# Patient Record
Sex: Male | Born: 1955 | Race: White | Hispanic: Yes | Marital: Single | Smoking: Never smoker
Health system: Southern US, Community
[De-identification: ages and names within clinical notes are randomized; demographics above are authoritative.]

## PROBLEM LIST (undated history)

## (undated) DIAGNOSIS — F329 Major depressive disorder, single episode, unspecified: Secondary | ICD-10-CM

## (undated) DIAGNOSIS — F32A Depression, unspecified: Secondary | ICD-10-CM

## (undated) HISTORY — PX: DENTAL SURGERY: SHX609

---

## 2016-11-26 ENCOUNTER — Ambulatory Visit
Admission: RE | Admit: 2016-11-26 | Discharge: 2016-11-26 | Disposition: A | Discharge status: Home / Self Care | Payer: Worker's Compensation | Source: Ambulatory Visit | Attending: Family | Admitting: Family

## 2016-11-26 ENCOUNTER — Other Ambulatory Visit: Payer: Self-pay | Admitting: Family

## 2016-11-26 DIAGNOSIS — R52 Pain, unspecified: Secondary | ICD-10-CM

## 2016-11-26 DIAGNOSIS — M25511 Pain in right shoulder: Secondary | ICD-10-CM | POA: Insufficient documentation

## 2018-06-30 ENCOUNTER — Emergency Department
Admission: EM | Admit: 2018-06-30 | Discharge: 2018-06-30 | Disposition: A | Discharge status: Home / Self Care | Payer: No Typology Code available for payment source | Attending: Student in an Organized Health Care Education/Training Program | Admitting: Student in an Organized Health Care Education/Training Program

## 2018-06-30 ENCOUNTER — Other Ambulatory Visit: Payer: Self-pay

## 2018-06-30 ENCOUNTER — Emergency Department: Payer: No Typology Code available for payment source

## 2018-06-30 ENCOUNTER — Encounter: Payer: Self-pay | Admitting: Emergency Medicine

## 2018-06-30 DIAGNOSIS — S6991XA Unspecified injury of right wrist, hand and finger(s), initial encounter: Secondary | ICD-10-CM | POA: Diagnosis present

## 2018-06-30 DIAGNOSIS — Y9301 Activity, walking, marching and hiking: Secondary | ICD-10-CM | POA: Insufficient documentation

## 2018-06-30 DIAGNOSIS — S52571A Other intraarticular fracture of lower end of right radius, initial encounter for closed fracture: Secondary | ICD-10-CM | POA: Diagnosis not present

## 2018-06-30 DIAGNOSIS — Y990 Civilian activity done for income or pay: Secondary | ICD-10-CM | POA: Insufficient documentation

## 2018-06-30 DIAGNOSIS — W010XXA Fall on same level from slipping, tripping and stumbling without subsequent striking against object, initial encounter: Secondary | ICD-10-CM | POA: Diagnosis not present

## 2018-06-30 DIAGNOSIS — Y9259 Other trade areas as the place of occurrence of the external cause: Secondary | ICD-10-CM | POA: Diagnosis not present

## 2018-06-30 MED ORDER — OXYCODONE-ACETAMINOPHEN 5-325 MG PO TABS: 1.0000 | ORAL_TABLET | ORAL | 0 refills | Status: DC | PRN

## 2018-06-30 MED ORDER — OXYCODONE-ACETAMINOPHEN 5-325 MG PO TABS
1.0000 | ORAL_TABLET | Freq: Once | ORAL | Status: AC
  Administered 2018-06-30: 1 via ORAL
  Filled 2018-06-30: qty 1

## 2018-06-30 NOTE — ED Provider Notes (Signed)
[facility] Emergency Department Provider Note  ____________________________________________  Time seen: Approximately 5:39 PM  I have reviewed the triage vital signs and the nursing notes.   HISTORY  Chief Complaint Wrist Pain    HPI Jamie May is a 62 y.o. male that presents to the emergency department for evaluation of right wrist pain after falling tonight.  He tripped while handling some equipment at work and landed backwards on his right wrist.  No numbness, tingling.  History reviewed. No pertinent past medical history.  There are no active problems to display for this patient.   History reviewed. No pertinent surgical history.  Prior to Admission medications   Medication Sig Start Date End Date Taking? Authorizing Provider  DULoxetine (CYMBALTA) 60 MG capsule Take 60 mg by mouth daily.   Yes [provider]  oxyCODONE-acetaminophen (PERCOCET) 5-325 MG tablet Take 1 tablet by mouth every 4 (four) hours as needed for severe pain. 06/30/18 06/30/19  Enid Derry, PA-C    Allergies Penicillins  No family history on file.  Social History Social History   Tobacco Use  . Smoking status: Never Smoker  . Smokeless tobacco: Never Used  Substance Use Topics  . Alcohol use: Not on file  . Drug use: Not on file     Review of Systems  Cardiovascular: No chest pain. Respiratory: No SOB. Gastrointestinal: No nausea, no vomiting.  Musculoskeletal: Positive for wrist pain.  Skin: Negative for rash, abrasions, lacerations, ecchymosis. Neurological: Negative for numbness or tingling   ____________________________________________   PHYSICAL EXAM:  VITAL SIGNS: ED Triage Vitals [06/30/18 1616]  Enc Vitals Group     BP      Pulse      Resp      Temp      Temp src      SpO2      Weight 185 lb (83.9 kg)     Height 5\' 8"  (1.727 m)     Head Circumference      Peak Flow      Pain Score 7     Pain Loc      Pain Edu?      Excl.  in GC?      Constitutional: Alert and oriented. Well appearing and in no acute distress. Eyes: Conjunctivae are normal. PERRL. EOMI. Head: Atraumatic. ENT:      Ears:      Nose: No congestion/rhinnorhea.      Mouth/Throat: Mucous membranes are moist.  Neck: No stridor.  Cardiovascular: Normal rate, regular rhythm.  Good peripheral circulation. Symmetric radial pulses bilaterally.  Respiratory: Normal respiratory effort without tachypnea or retractions. Lungs CTAB. Good air entry to the bases with no decreased or absent breath sounds. Musculoskeletal: Full range of motion to all extremities. No gross deformities appreciated. Moderate swelling to right wrist.  Neurologic:  Normal speech and language. No gross focal neurologic deficits are appreciated.  Skin:  Skin is warm, dry and intact. No rash noted. Psychiatric: Mood and affect are normal. Speech and behavior are normal. Patient exhibits appropriate insight and judgement.   ____________________________________________   LABS (all labs ordered are listed, but only abnormal results are displayed)  Labs Reviewed - No data to display ____________________________________________  EKG   ____________________________________________  RADIOLOGY Lexine Baton, personally viewed and evaluated these images (plain radiographs) as part of my medical decision making, as well as reviewing the written report by the radiologist.  Dg Wrist Complete Right  Result Date: 06/30/2018 CLINICAL DATA:  62 year old male with right wrist pain and swelling after falling earlier today EXAM: RIGHT WRIST - COMPLETE 3+ VIEW COMPARISON:  None. FINDINGS: Acute comminuted fracture through the distal radius extending to the articular surface with dorsal tilt of the fracture fragments. Acute mildly dorsally displaced ulnar styloid fracture. The remaining visualized bones and joints are intact. Mild degenerative change present at the STT joint. Small subchondral  cysts present in the lunate. IMPRESSION: 1. Acute comminuted distal radius fracture with dorsal tilt and extension to the articular surface. 2. Acute minimally displaced ulnar styloid fracture. 3. Mild degenerative changes in the wrist. Electronically Signed   By: Malachy Moan M.D.   On: 06/30/2018 17:27    ____________________________________________    PROCEDURES  Procedure(s) performed:    Procedures    Medications  oxyCODONE-acetaminophen (PERCOCET/ROXICET) 5-325 MG per tablet 1 tablet (1 tablet Oral Given 06/30/18 1817)     ____________________________________________   INITIAL IMPRESSION / ASSESSMENT AND PLAN / ED COURSE  Pertinent labs & imaging results that were available during my care of the patient were reviewed by me and considered in my medical decision making (see chart for details).  Review of the [place] CSRS was performed in accordance of the [facility] prior to dispensing any controlled drugs.     Patient's diagnosis is consistent with comminuted distal radius fracture and ulnar styloid fracture.  Vital signs and exam are reassuring.  X-ray consistent with fractures.  Arm is neurovascularly intact.  Dr. Rosita Kea was consulted and recommended splinting patient and have him follow-up with him in clinic tomorrow with plans of surgery next week.  Splint was placed.  Arm sling was given.  Patient will be discharged home with prescriptions for a short course of Percocet.  Patient is to follow up with orthopedics as directed. Patient is given ED precautions to return to the ED for any worsening or new symptoms.     ____________________________________________  FINAL CLINICAL IMPRESSION(S) / ED DIAGNOSES  Final diagnoses:  Other closed intra-articular fracture of distal end of right radius, initial encounter      NEW MEDICATIONS STARTED DURING THIS VISIT:  ED Discharge Orders         Ordered    oxyCODONE-acetaminophen (PERCOCET) 5-325 MG tablet  Every 4 hours PRN      06/30/18 1826              This chart was dictated using voice recognition software/Dragon. Despite best efforts to proofread, errors can occur which can change the meaning. Any change was purely unintentional.    Enid Derry, PA-C 06/30/18 2324    Willy Eddy, MD 07/04/18 [phone]

## 2018-06-30 NOTE — ED Notes (Signed)
Dc instructions discussed, informed patient to call first thing in the morning to follow up with Dr. Rosita Kea, pt given [employer] physician sheets to give to Dr. Rosita Kea to fill out for work.  Pt given RX for percocet, informed him to take a dose after 10pm if necessary. Pt ambulatory to lobby without assistance.

## 2018-06-30 NOTE — Discharge Instructions (Signed)
Please call Dr. Neomia Glass office tomorrow for an appointment tomorrow.  He is expecting you.

## 2018-06-30 NOTE — ED Triage Notes (Signed)
Presents s/p fall   states he tripped over something and fell  having pain to right wrist ares

## 2018-07-05 ENCOUNTER — Ambulatory Visit: Payer: No Typology Code available for payment source | Admitting: Anesthesiology

## 2018-07-05 ENCOUNTER — Encounter: Payer: Self-pay | Admitting: Anesthesiology

## 2018-07-05 ENCOUNTER — Other Ambulatory Visit: Payer: Self-pay

## 2018-07-05 ENCOUNTER — Encounter
Admission: RE | Disposition: A | Discharge status: Home / Self Care | Payer: Self-pay | Source: Ambulatory Visit | Attending: Orthopedic Surgery

## 2018-07-05 ENCOUNTER — Ambulatory Visit: Payer: No Typology Code available for payment source

## 2018-07-05 ENCOUNTER — Ambulatory Visit
Admission: RE | Admit: 2018-07-05 | Discharge: 2018-07-05 | Disposition: A | Discharge status: Home / Self Care | Payer: No Typology Code available for payment source | Source: Ambulatory Visit | Attending: Orthopedic Surgery | Admitting: Orthopedic Surgery

## 2018-07-05 DIAGNOSIS — W010XXA Fall on same level from slipping, tripping and stumbling without subsequent striking against object, initial encounter: Secondary | ICD-10-CM | POA: Diagnosis not present

## 2018-07-05 DIAGNOSIS — S52571A Other intraarticular fracture of lower end of right radius, initial encounter for closed fracture: Secondary | ICD-10-CM | POA: Insufficient documentation

## 2018-07-05 DIAGNOSIS — Z8781 Personal history of (healed) traumatic fracture: Secondary | ICD-10-CM

## 2018-07-05 DIAGNOSIS — Z79899 Other long term (current) drug therapy: Secondary | ICD-10-CM | POA: Insufficient documentation

## 2018-07-05 DIAGNOSIS — Y990 Civilian activity done for income or pay: Secondary | ICD-10-CM | POA: Diagnosis not present

## 2018-07-05 DIAGNOSIS — F329 Major depressive disorder, single episode, unspecified: Secondary | ICD-10-CM | POA: Insufficient documentation

## 2018-07-05 DIAGNOSIS — Y9289 Other specified places as the place of occurrence of the external cause: Secondary | ICD-10-CM | POA: Diagnosis not present

## 2018-07-05 DIAGNOSIS — Z9889 Other specified postprocedural states: Secondary | ICD-10-CM

## 2018-07-05 HISTORY — DX: Major depressive disorder, single episode, unspecified: F32.9

## 2018-07-05 HISTORY — PX: OPEN REDUCTION INTERNAL FIXATION (ORIF) DISTAL RADIAL FRACTURE: SHX5989

## 2018-07-05 HISTORY — DX: Depression, unspecified: F32.A

## 2018-07-05 SURGERY — OPEN REDUCTION INTERNAL FIXATION (ORIF) DISTAL RADIUS FRACTURE
Anesthesia: General | Site: Wrist | Laterality: Right | Wound class: "Clean "

## 2018-07-05 MED ORDER — LACTATED RINGERS IV SOLN
INTRAVENOUS | Status: DC
  Administered 2018-07-05: 14:00:00 via INTRAVENOUS

## 2018-07-05 MED ORDER — ACETAMINOPHEN 10 MG/ML IV SOLN
INTRAVENOUS | Status: DC | PRN
  Administered 2018-07-05: 1000 mg via INTRAVENOUS

## 2018-07-05 MED ORDER — OXYCODONE-ACETAMINOPHEN 5-325 MG PO TABS
ORAL_TABLET | ORAL | Status: AC
  Administered 2018-07-05: 1
  Filled 2018-07-05: qty 1

## 2018-07-05 MED ORDER — FENTANYL CITRATE (PF) 100 MCG/2ML IJ SOLN
INTRAMUSCULAR | Status: AC
  Filled 2018-07-05: qty 2

## 2018-07-05 MED ORDER — OXYCODONE-ACETAMINOPHEN 5-325 MG PO TABS: 1.0000 | ORAL_TABLET | ORAL | 0 refills | Status: AC | PRN

## 2018-07-05 MED ORDER — HYDROMORPHONE HCL 1 MG/ML IJ SOLN
INTRAMUSCULAR | Status: AC
  Filled 2018-07-05: qty 1

## 2018-07-05 MED ORDER — PROPOFOL 10 MG/ML IV BOLUS
INTRAVENOUS | Status: DC | PRN
  Administered 2018-07-05: 200 mg via INTRAVENOUS

## 2018-07-05 MED ORDER — DEXAMETHASONE SODIUM PHOSPHATE 10 MG/ML IJ SOLN
INTRAMUSCULAR | Status: AC
  Filled 2018-07-05: qty 1

## 2018-07-05 MED ORDER — KETOROLAC TROMETHAMINE 30 MG/ML IJ SOLN
INTRAMUSCULAR | Status: DC | PRN
  Administered 2018-07-05: 30 mg via INTRAVENOUS

## 2018-07-05 MED ORDER — PROPOFOL 10 MG/ML IV BOLUS
INTRAVENOUS | Status: AC
  Filled 2018-07-05: qty 20

## 2018-07-05 MED ORDER — ONDANSETRON HCL 4 MG/2ML IJ SOLN
INTRAMUSCULAR | Status: DC | PRN
  Administered 2018-07-05: 4 mg via INTRAVENOUS

## 2018-07-05 MED ORDER — NEOMYCIN-POLYMYXIN B GU 40-200000 IR SOLN
Status: DC | PRN
  Administered 2018-07-05: 2 mL

## 2018-07-05 MED ORDER — FENTANYL CITRATE (PF) 100 MCG/2ML IJ SOLN
25.0000 ug | INTRAMUSCULAR | Status: DC | PRN
  Administered 2018-07-05 (×4): 25 ug via INTRAVENOUS

## 2018-07-05 MED ORDER — ONDANSETRON HCL 4 MG/2ML IJ SOLN
INTRAMUSCULAR | Status: AC
  Filled 2018-07-05: qty 2

## 2018-07-05 MED ORDER — CLINDAMYCIN PHOSPHATE 900 MG/50ML IV SOLN
INTRAVENOUS | Status: AC
  Filled 2018-07-05: qty 50

## 2018-07-05 MED ORDER — ONDANSETRON HCL 4 MG/2ML IJ SOLN: 4.0000 mg | Freq: Once | INTRAMUSCULAR | Status: DC | PRN

## 2018-07-05 MED ORDER — FAMOTIDINE 20 MG PO TABS
ORAL_TABLET | ORAL | Status: AC
  Filled 2018-07-05: qty 1

## 2018-07-05 MED ORDER — CLINDAMYCIN PHOSPHATE 900 MG/50ML IV SOLN
900.0000 mg | Freq: Once | INTRAVENOUS | Status: AC
  Administered 2018-07-05: 900 mg via INTRAVENOUS

## 2018-07-05 MED ORDER — FAMOTIDINE 20 MG PO TABS
20.0000 mg | ORAL_TABLET | Freq: Once | ORAL | Status: AC
  Administered 2018-07-05: 20 mg via ORAL

## 2018-07-05 MED ORDER — ACETAMINOPHEN 10 MG/ML IV SOLN
INTRAVENOUS | Status: AC
  Filled 2018-07-05: qty 100

## 2018-07-05 MED ORDER — HYDROMORPHONE HCL 1 MG/ML IJ SOLN
0.5000 mg | INTRAMUSCULAR | Status: DC | PRN
  Administered 2018-07-05 (×2): 0.5 mg via INTRAVENOUS

## 2018-07-05 MED ORDER — LIDOCAINE HCL (CARDIAC) PF 100 MG/5ML IV SOSY
PREFILLED_SYRINGE | INTRAVENOUS | Status: DC | PRN
  Administered 2018-07-05: 50 mg via INTRAVENOUS

## 2018-07-05 MED ORDER — DEXAMETHASONE SODIUM PHOSPHATE 10 MG/ML IJ SOLN
INTRAMUSCULAR | Status: DC | PRN
  Administered 2018-07-05: 5 mg via INTRAVENOUS

## 2018-07-05 MED ORDER — FENTANYL CITRATE (PF) 100 MCG/2ML IJ SOLN
INTRAMUSCULAR | Status: DC | PRN
  Administered 2018-07-05 (×2): 50 ug via INTRAVENOUS
  Administered 2018-07-05: 25 ug via INTRAVENOUS
  Administered 2018-07-05: 50 ug via INTRAVENOUS
  Administered 2018-07-05: 25 ug via INTRAVENOUS

## 2018-07-05 SURGICAL SUPPLY — 44 items
BANDAGE ACE 4X5 VEL STRL LF (GAUZE/BANDAGES/DRESSINGS) ×3 IMPLANT
BIT DRILL 2 FAST STEP (BIT) ×2 IMPLANT
BIT DRILL 2.5X4 QC (BIT) ×2 IMPLANT
CANISTER SUCT 1200ML W/VALVE (MISCELLANEOUS) ×3 IMPLANT
CHLORAPREP W/TINT 26ML (MISCELLANEOUS) ×3 IMPLANT
CUFF TOURN 18 STER (MISCELLANEOUS) IMPLANT
DRAPE FLUOR MINI C-ARM 54X84 (DRAPES) ×3 IMPLANT
ELECT REM PT RETURN 9FT ADLT (ELECTROSURGICAL) ×3
ELECTRODE REM PT RTRN 9FT ADLT (ELECTROSURGICAL) ×1 IMPLANT
GAUZE PETRO XEROFOAM 1X8 (MISCELLANEOUS) ×6 IMPLANT
GAUZE SPONGE 4X4 12PLY STRL (GAUZE/BANDAGES/DRESSINGS) ×3 IMPLANT
GLOVE SURG SYN 9.0  PF PI (GLOVE) ×2
GLOVE SURG SYN 9.0 PF PI (GLOVE) ×1 IMPLANT
GOWN SRG 2XL LVL 4 RGLN SLV (GOWNS) ×1 IMPLANT
GOWN STRL NON-REIN 2XL LVL4 (GOWNS) ×2
GOWN STRL REUS W/ TWL LRG LVL3 (GOWN DISPOSABLE) ×1 IMPLANT
GOWN STRL REUS W/TWL LRG LVL3 (GOWN DISPOSABLE) ×2
K-WIRE 1.6 (WIRE) ×2
K-WIRE FX5X1.6XNS BN SS (WIRE) ×1
KIT TURNOVER KIT A (KITS) ×3 IMPLANT
KWIRE FX5X1.6XNS BN SS (WIRE) IMPLANT
NDL FILTER BLUNT 18X1 1/2 (NEEDLE) ×1 IMPLANT
NEEDLE FILTER BLUNT 18X 1/2SAF (NEEDLE) ×2
NEEDLE FILTER BLUNT 18X1 1/2 (NEEDLE) ×1 IMPLANT
NS IRRIG 500ML POUR BTL (IV SOLUTION) ×3 IMPLANT
PACK EXTREMITY ARMC (MISCELLANEOUS) ×3 IMPLANT
PAD CAST CTTN 4X4 STRL (SOFTGOODS) ×2 IMPLANT
PADDING CAST COTTON 4X4 STRL (SOFTGOODS) ×4
PEG SUBCHONDRAL SMOOTH 2.0X16 (Peg) ×2 IMPLANT
PEG SUBCHONDRAL SMOOTH 2.0X20 (Peg) ×2 IMPLANT
PEG SUBCHONDRAL SMOOTH 2.0X22 (Peg) ×6 IMPLANT
PEG SUBCHONDRAL SMOOTH 2.0X24 (Peg) ×4 IMPLANT
PLATE SHORT 24.4X51.3 RT (Plate) ×2 IMPLANT
PUTTY BONE .5ML HUMAN (Tissue) ×2 IMPLANT
SCALPEL PROTECTED #15 DISP (BLADE) ×6 IMPLANT
SCREW BN 12X3.5XNS CORT TI (Screw) IMPLANT
SCREW CORT 3.5X10 LNG (Screw) ×2 IMPLANT
SCREW CORT 3.5X12 (Screw) ×4 IMPLANT
SPLINT CAST 1 STEP 3X12 (MISCELLANEOUS) ×3 IMPLANT
SUT ETHILON 4-0 (SUTURE) ×2
SUT ETHILON 4-0 FS2 18XMFL BLK (SUTURE) ×1
SUT VICRYL 3-0 27IN (SUTURE) ×3 IMPLANT
SUTURE ETHLN 4-0 FS2 18XMF BLK (SUTURE) ×1 IMPLANT
SYR 3ML LL SCALE MARK (SYRINGE) ×3 IMPLANT

## 2018-07-05 NOTE — Anesthesia Procedure Notes (Signed)
Procedure Name: LMA Insertion Date/Time: 07/05/2018 3:50 PM Performed by: Omer Jack, CRNA Pre-anesthesia Checklist: Patient identified, Patient being monitored, Timeout performed, Emergency Drugs available and Suction available Patient Re-evaluated:Patient Re-evaluated prior to induction Oxygen Delivery Method: Circle system utilized Preoxygenation: Pre-oxygenation with 100% oxygen Induction Type: IV induction Ventilation: Mask ventilation without difficulty LMA: LMA inserted LMA Size: 4.0 Tube type: Oral Number of attempts: 1 Placement Confirmation: positive ETCO2 and breath sounds checked- equal and bilateral Tube secured with: Tape Dental Injury: Teeth and Oropharynx as per pre-operative assessment

## 2018-07-05 NOTE — Transfer of Care (Signed)
Immediate Anesthesia Transfer of Care Note  Patient: Jamie May  Procedure(s) Performed: OPEN REDUCTION INTERNAL FIXATION (ORIF) DISTAL RADIAL FRACTURE (Right Wrist)  Patient Location: PACU  Anesthesia Type:General  Level of Consciousness: sedated  Airway & Oxygen Therapy: Patient Spontanous Breathing and Patient connected to face mask oxygen  Post-op Assessment: Report given to RN and Post -op Vital signs reviewed and stable  Post vital signs: Reviewed and stable  Last Vitals:  Vitals Value Taken Time  BP 135/80 07/05/2018  5:00 PM  Temp 36.7 C 07/05/2018  5:00 PM  Pulse 78 07/05/2018  5:02 PM  Resp 14 07/05/2018  5:02 PM  SpO2 97 % 07/05/2018  5:02 PM  Vitals shown include unvalidated device data.  Last Pain:  Vitals:   07/05/18 1700  TempSrc:   PainSc: 10-Worst pain ever         Complications: No apparent anesthesia complications

## 2018-07-05 NOTE — Anesthesia Preprocedure Evaluation (Signed)
Anesthesia Evaluation  Patient identified by MRN, date of birth, ID band Patient awake    Reviewed: Allergy & Precautions, NPO status , Patient's Chart, lab work & pertinent test results, reviewed documented beta blocker date and time   Airway Mallampati: II  TM Distance: >3 FB     Dental  (+) Chipped   Pulmonary           Cardiovascular      Neuro/Psych PSYCHIATRIC DISORDERS Depression    GI/Hepatic   Endo/Other    Renal/GU      Musculoskeletal   Abdominal   Peds  Hematology   Anesthesia Other Findings His sats tend to be low.  Reproductive/Obstetrics                             Anesthesia Physical Anesthesia Plan  ASA: II  Anesthesia Plan: General   Post-op Pain Management:    Induction: Intravenous  PONV Risk Score and Plan:   Airway Management Planned: LMA  Additional Equipment:   Intra-op Plan:   Post-operative Plan:   Informed Consent: I have reviewed the patients History and Physical, chart, labs and discussed the procedure including the risks, benefits and alternatives for the proposed anesthesia with the patient or authorized representative who has indicated his/her understanding and acceptance.     Plan Discussed with: CRNA  Anesthesia Plan Comments:         Anesthesia Quick Evaluation

## 2018-07-05 NOTE — H&P (Signed)
Reviewed paper H+P, will be scanned into chart. No changes noted.  

## 2018-07-05 NOTE — Anesthesia Post-op Follow-up Note (Signed)
Anesthesia QCDR form completed.        

## 2018-07-05 NOTE — Progress Notes (Signed)
Right hand pink and warm to touch.  Patient has Sensation.

## 2018-07-05 NOTE — Op Note (Signed)
07/05/2018  5:01 PM  PATIENT:  Jamie May  62 y.o. male  PRE-OPERATIVE DIAGNOSIS:  CLOSED DISPLACED FRACTURE OF RIGHT distal radius, 3 intra-articular fragments  POST-OPERATIVE DIAGNOSIS: Same  PROCEDURE:  Procedure(s): OPEN REDUCTION INTERNAL FIXATION (ORIF) DISTAL RADIAL FRACTURE (Right)  SURGEON: Leitha Schuller, MD  ASSISTANTS: None  ANESTHESIA:   general  EBL:  Total I/O In: 700 [I.V.:700] Out: 5 [Blood:5]  BLOOD ADMINISTERED:none  DRAINS: none   LOCAL MEDICATIONS USED:  NONE  SPECIMEN:  No Specimen  DISPOSITION OF SPECIMEN:  N/A  COUNTS:  YES  TOURNIQUET:  * Missing tourniquet times found for documented tourniquets in log: [id number] *  IMPLANTS: [employer] standard right short DVR plate with multiple screws and smooth pegs  DICTATION: .Dragon Dictation patient was brought to the operating room and after adequate anesthesia was obtained the right arm was prepped draped you sterile fashion.  Appropriate patient identification and timeout procedures was completed and traction applied off the end of the table.  A volar approach was made after raising the tourniquet centered over the FCR tendon.  The tendon sheath was incised and the tendon retracted radially.  The deep fascia was incised and the muscle exposed to expose the pronator.  This was elevated off the radial border of the proximal and distal fragments.  The traction helped restore length and then with the use of a Freer elevator length was restored and held in position.  A short DVR plate was applied standard with and pinned in position to make sure was appropriately positioned on the shaft and distal fragment.  The distal screw holes were filled first using standard technique drilling and placing the smooth pegs.  With traction applied the plate was then affixed to the shaft to restore length and volar tilt with 3 cortical screws.  There was some compression at the fracture site so DBX bone putty 0.5 cc was  inserted into that gap.  The wound was thoroughly irrigated and tourniquet let down.  The wound was closed with 3-0 Vicryl subcutaneously followed by 4-0 nylon in a simple interrupted fashion followed by Xeroform 4 x 4's web roll a volar splint and an Ace wrap  PLAN OF CARE: Discharge to home after PACU  PATIENT DISPOSITION:  PACU - hemodynamically stable.

## 2018-07-05 NOTE — Discharge Instructions (Addendum)
Keep arm elevated as much as possible.  Work on finger range of motion is much as possible.  AMBULATORY SURGERY  DISCHARGE INSTRUCTIONS   1) The drugs that you were given will stay in your system until tomorrow so for the next 24 hours you should not:  A) Drive an automobile B) Make any legal decisions C) Drink any alcoholic beverage   2) You may resume regular meals tomorrow.  Today it is better to start with liquids and gradually work up to solid foods.  You may eat anything you prefer, but it is better to start with liquids, then soup and crackers, and gradually work up to solid foods.   3) Please notify your doctor immediately if you have any unusual bleeding, trouble breathing, redness and pain at the surgery site, drainage, fever, or pain not relieved by medication.    4) Additional Instructions:        Please contact your physician with any problems or Same Day Surgery at [phone], Monday through Friday 6 am to 4 pm, or [place] [facility] number at [phone].

## 2018-07-06 ENCOUNTER — Encounter: Payer: Self-pay | Admitting: Orthopedic Surgery

## 2018-07-12 NOTE — Anesthesia Postprocedure Evaluation (Signed)
Anesthesia Post Note  Patient: Jamie LefevreSteven May  Procedure(s) Performed: OPEN REDUCTION INTERNAL FIXATION (ORIF) DISTAL RADIAL FRACTURE (Right Wrist)  Patient location during evaluation: PACU Anesthesia Type: General Level of consciousness: awake and alert Pain management: pain level controlled Vital Signs Assessment: post-procedure vital signs reviewed and stable Respiratory status: spontaneous breathing, nonlabored ventilation, respiratory function stable and patient connected to nasal cannula oxygen Cardiovascular status: blood pressure returned to baseline and stable Postop Assessment: no apparent nausea or vomiting Anesthetic complications: no     Last Vitals:  Vitals:   07/05/18 1747 07/05/18 1801  BP: 140/61 (!) 130/55  Pulse: 75 73  Resp: 14   Temp: 36.8 C   SpO2: 99% 100%    Last Pain:  Vitals:   07/06/18 0819  TempSrc:   PainSc: 0-No pain                 Yevette EdwardsJames G Adams

## 2018-07-13 ENCOUNTER — Encounter: Payer: Self-pay | Admitting: Orthopedic Surgery

## 2019-02-15 IMAGING — DX DG WRIST COMPLETE 3+V*R*
4 series · 4 of 4 positions shown · non-contrast
Comparison: None.

CLINICAL DATA: 62-year-old male with right wrist pain and swelling
after falling earlier today

EXAM:
RIGHT WRIST - COMPLETE 3+ VIEW

[wrist ap (1 of 2)]
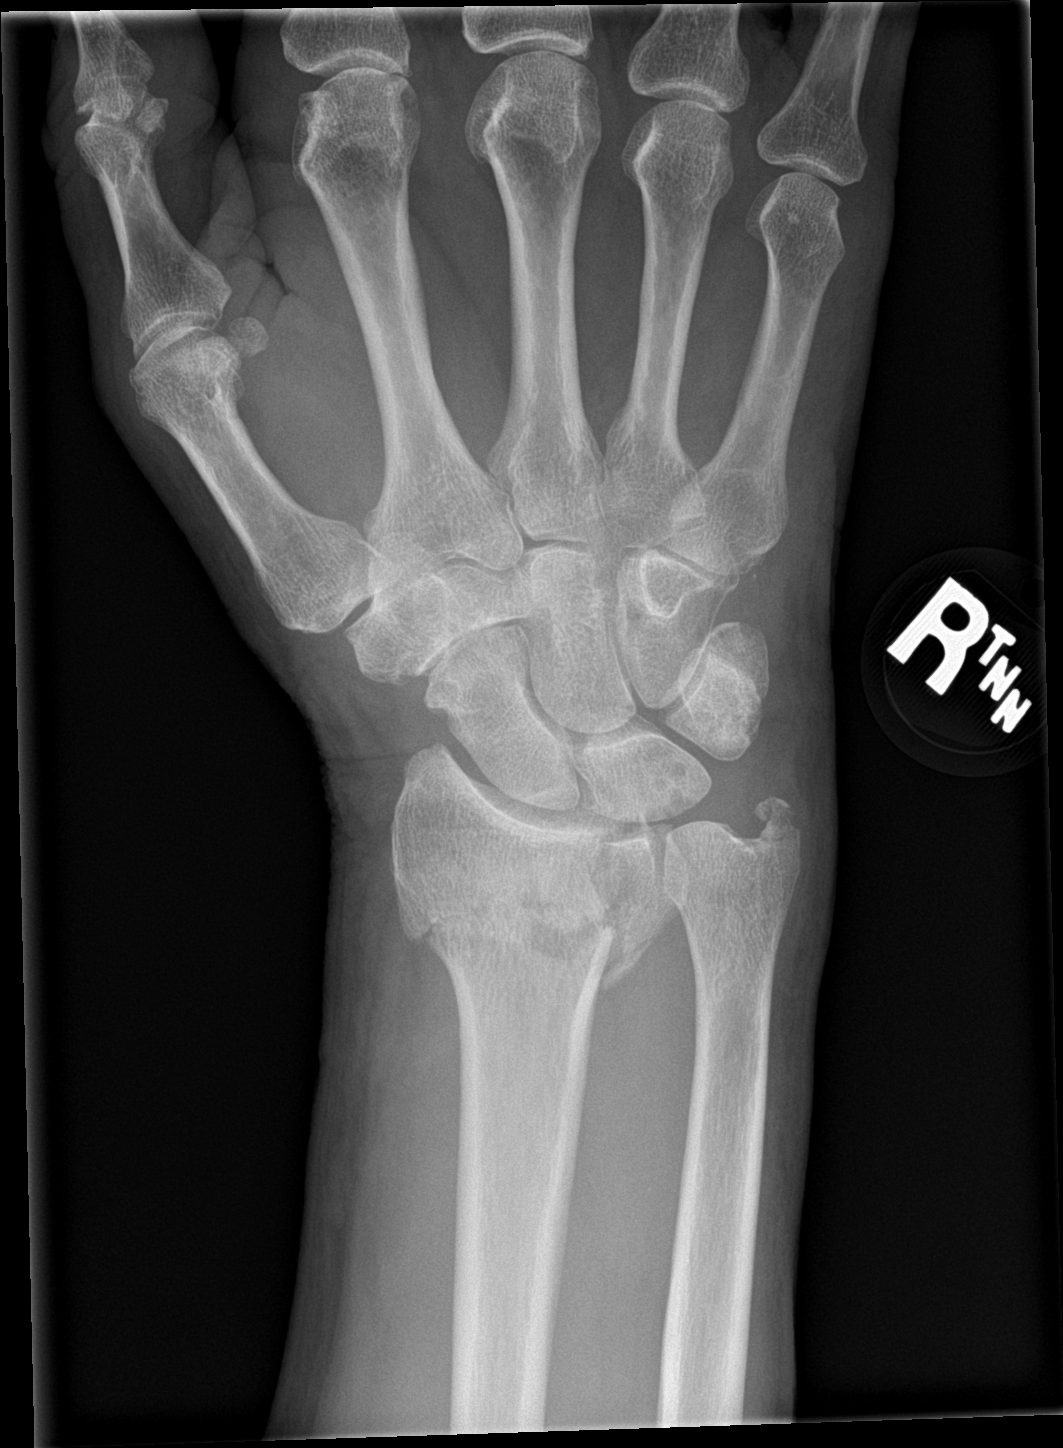

[wrist obl]
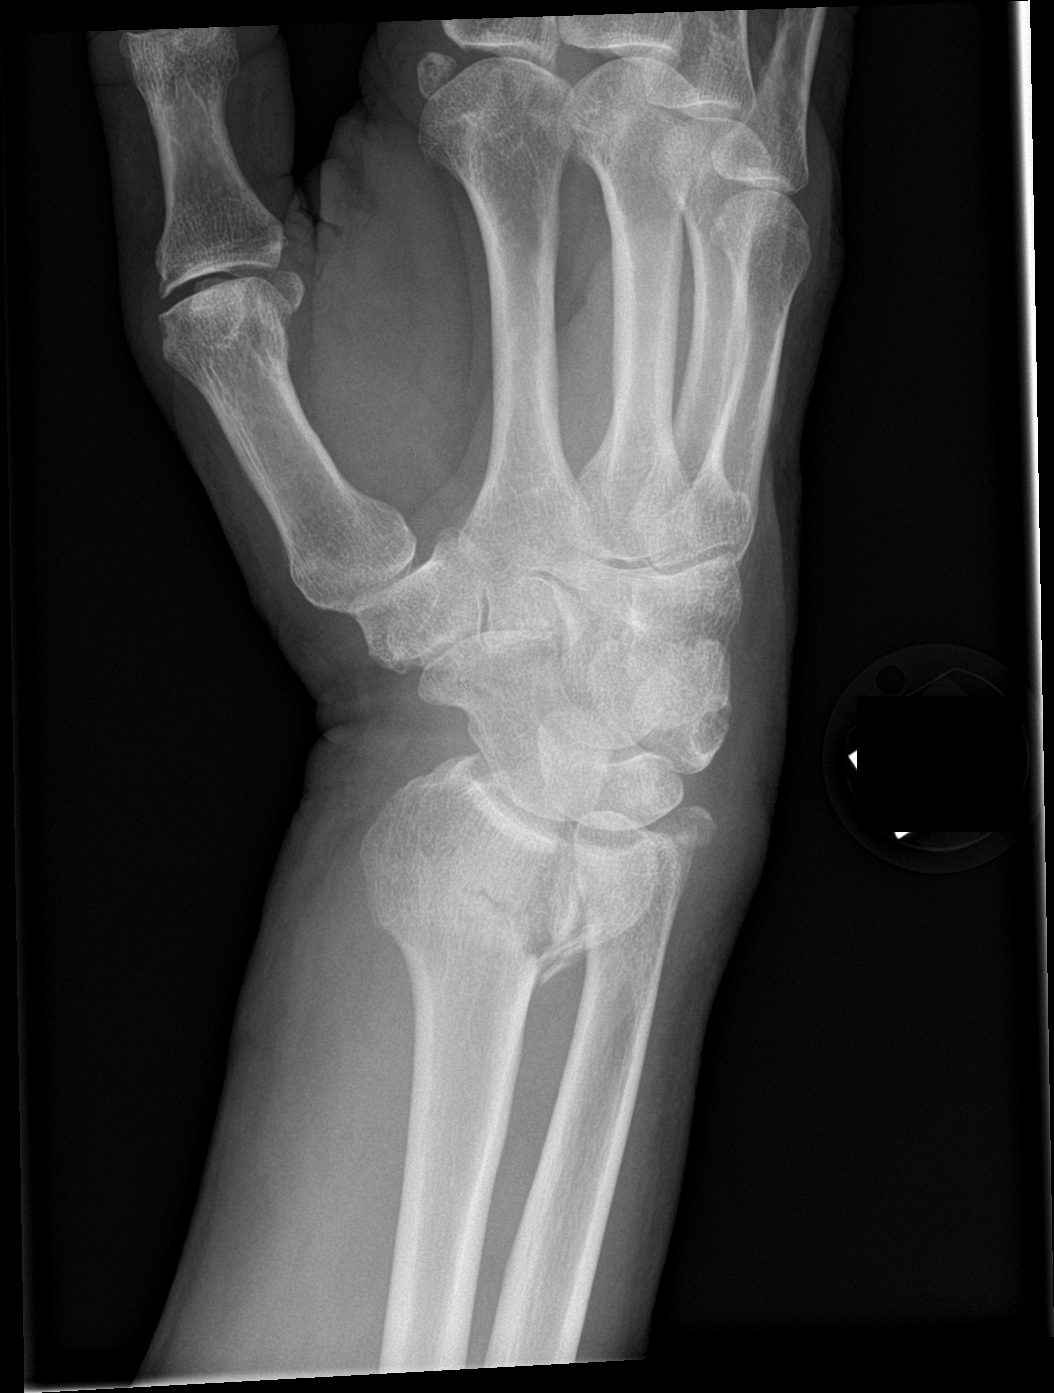

[wrist lat]
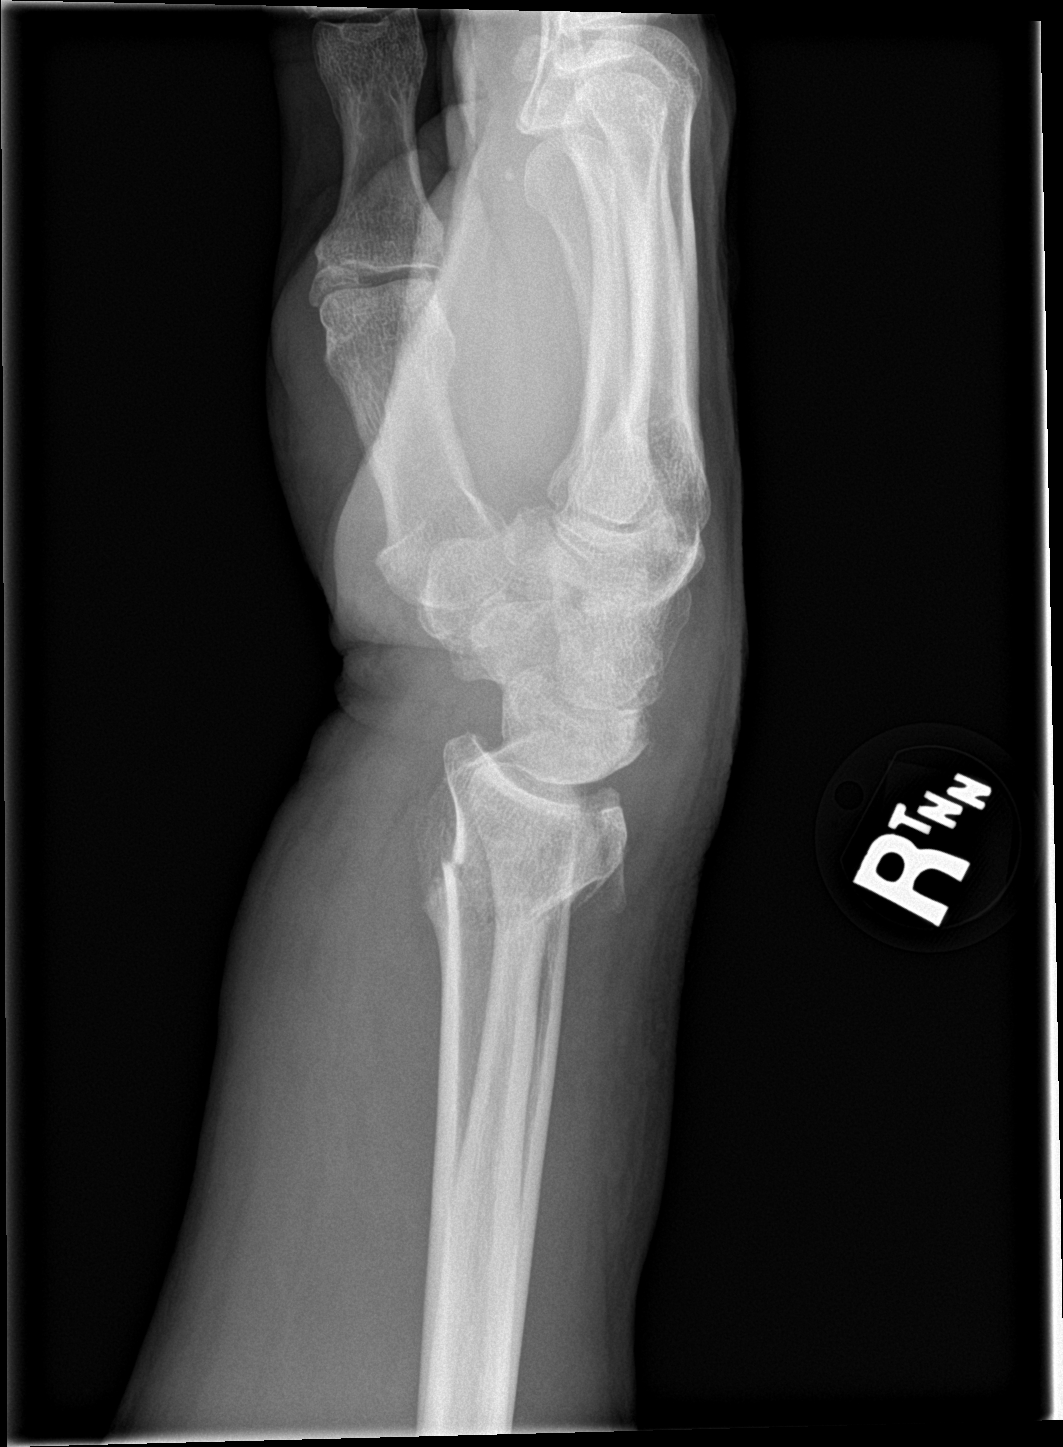

[wrist ap (2 of 2)]
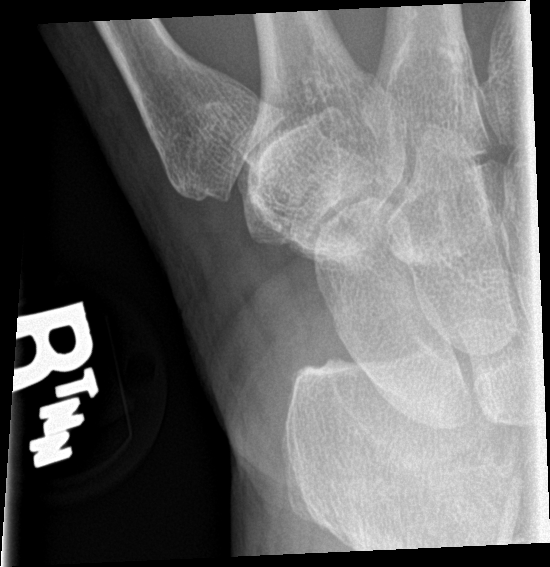

[4 of 4 positions shown; findings below may reference images not displayed]

FINDINGS: Acute comminuted fracture through the distal radius extending to the
articular surface with dorsal tilt of the fracture fragments. Acute
mildly dorsally displaced ulnar styloid fracture. The remaining
visualized bones and joints are intact. Mild degenerative change
present at the STT joint. Small subchondral cysts present in the
lunate.
IMPRESSION: 1. Acute comminuted distal radius fracture with dorsal tilt and
extension to the articular surface.
2. Acute minimally displaced ulnar styloid fracture.
3. Mild degenerative changes in the wrist.

## 2019-02-20 IMAGING — DX DG WRIST 2V*R*
2 series · 2 of 2 positions shown · non-contrast
Comparison: 06/30/2017

CLINICAL DATA: Postop

EXAM:
RIGHT WRIST - 2 VIEW

[wrist ap]
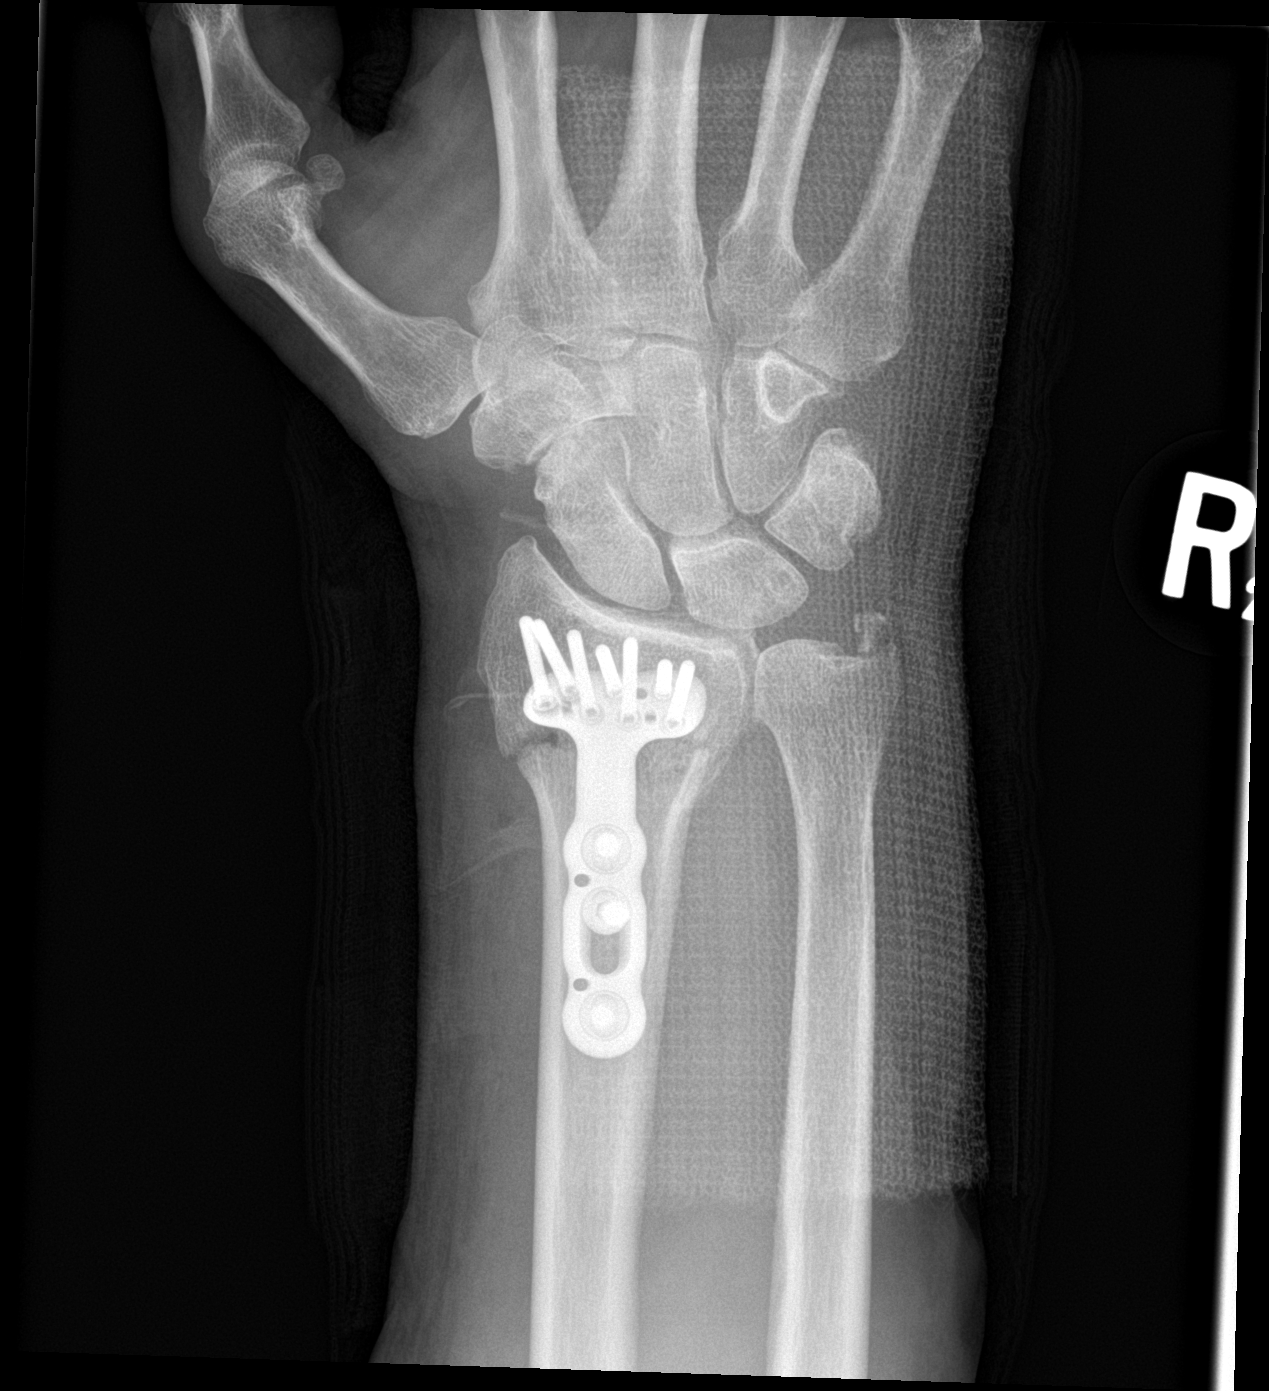

[wrist lat]
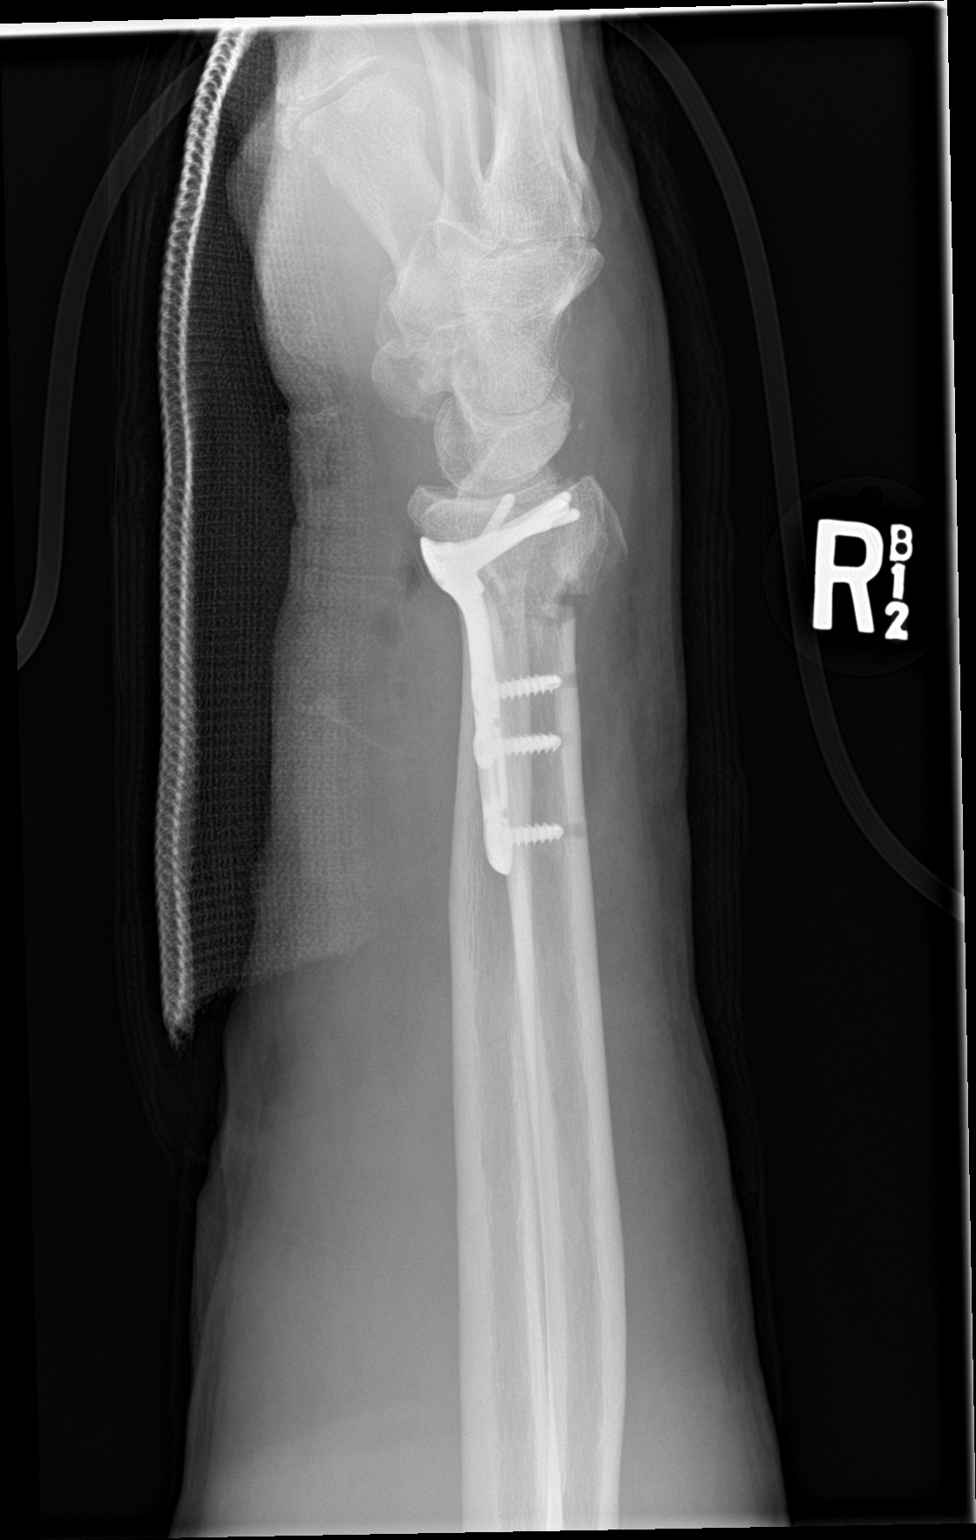

[2 of 2 positions shown; findings below may reference images not displayed]

FINDINGS: Acute comminuted fracture ulnar styloid process. Interval surgical
plate and screw fixation of comminuted intra-articular distal radius
fracture with decreased dorsal displacement and angulation.
IMPRESSION: 1. Interval plate and screw fixation acute comminuted
intra-articular distal radius fracture with decreased dorsal
displacement and angulation
2. Acute minimally displaced ulnar styloid process fracture.

## 2023-04-09 ENCOUNTER — Telehealth: Payer: Self-pay | Admitting: Internal Medicine

## 2023-04-09 NOTE — Telephone Encounter (Signed)
Patient left VM requesting a call back.

## 2023-05-12 ENCOUNTER — Ambulatory Visit: Payer: 59

## 2023-05-12 DIAGNOSIS — K635 Polyp of colon: Secondary | ICD-10-CM

## 2023-05-12 DIAGNOSIS — D122 Benign neoplasm of ascending colon: Secondary | ICD-10-CM

## 2023-05-12 DIAGNOSIS — K5730 Diverticulosis of large intestine without perforation or abscess without bleeding: Secondary | ICD-10-CM

## 2023-05-12 DIAGNOSIS — D128 Benign neoplasm of rectum: Secondary | ICD-10-CM

## 2023-05-12 DIAGNOSIS — Z86010 Personal history of colonic polyps: Secondary | ICD-10-CM | POA: Diagnosis not present

## 2023-05-12 DIAGNOSIS — K640 First degree hemorrhoids: Secondary | ICD-10-CM

## 2023-05-12 DIAGNOSIS — Z1211 Encounter for screening for malignant neoplasm of colon: Secondary | ICD-10-CM | POA: Diagnosis present
